# Patient Record
Sex: Female | Born: 1985 | Race: White | Hispanic: No | Marital: Single
Health system: Southern US, Community
[De-identification: ages and names within clinical notes are randomized; demographics above are authoritative.]

---

## 2011-08-15 ENCOUNTER — Ambulatory Visit: Payer: Self-pay | Admitting: Surgery

## 2011-10-16 ENCOUNTER — Emergency Department: Payer: Self-pay | Admitting: Unknown Physician Specialty

## 2011-10-16 LAB — CSF CELL COUNT WITH DIFFERENTIAL
CSF Tube #: 1
CSF Tube #: 3
Lymphocytes: 0 %
Monocytes/Macrophages: 0 %
Neutrophils: 0 %
Neutrophils: 0 %
Other Cells: 0 %
RBC (CSF): 2 /mm3
RBC (CSF): 7 /mm3
WBC (CSF): 0 /mm3

## 2011-10-16 LAB — CBC
HCT: 37.7 % (ref 35.0–47.0)
HGB: 12.7 g/dL (ref 12.0–16.0)
MCHC: 33.7 g/dL (ref 32.0–36.0)
RDW: 13.3 % (ref 11.5–14.5)
WBC: 7.4 10*3/uL (ref 3.6–11.0)

## 2011-10-16 LAB — COMPREHENSIVE METABOLIC PANEL
Albumin: 4.5 g/dL (ref 3.4–5.0)
Alkaline Phosphatase: 75 U/L (ref 50–136)
Anion Gap: 12 (ref 7–16)
BUN: 10 mg/dL (ref 7–18)
Calcium, Total: 9.7 mg/dL (ref 8.5–10.1)
Chloride: 105 mmol/L (ref 98–107)
Co2: 26 mmol/L (ref 21–32)
Glucose: 108 mg/dL — ABNORMAL HIGH (ref 65–99)
Potassium: 3.6 mmol/L (ref 3.5–5.1)
SGOT(AST): 26 U/L (ref 15–37)
SGPT (ALT): 26 U/L

## 2011-10-16 LAB — GLUCOSE, CSF: Glucose, CSF: 66 mg/dL (ref 40–75)

## 2011-10-16 LAB — SEDIMENTATION RATE: Erythrocyte Sed Rate: 13 mm/hr (ref 0–20)

## 2011-10-16 LAB — PROTEIN, CSF: Protein, CSF: 26 mg/dL (ref 15–45)

## 2011-10-21 LAB — CSF CULTURE W GRAM STAIN

## 2012-01-15 ENCOUNTER — Ambulatory Visit: Payer: Self-pay | Admitting: Family Medicine

## 2012-03-03 ENCOUNTER — Ambulatory Visit: Payer: Self-pay | Admitting: Obstetrics and Gynecology

## 2012-03-03 LAB — COMPREHENSIVE METABOLIC PANEL
Alkaline Phosphatase: 84 U/L (ref 50–136)
BUN: 7 mg/dL (ref 7–18)
Bilirubin,Total: 0.3 mg/dL (ref 0.2–1.0)
Chloride: 107 mmol/L (ref 98–107)
EGFR (African American): >60
EGFR (Non-African Amer.): >60
Glucose: 86 mg/dL (ref 65–99)
Osmolality: 279 (ref 275–301)
Potassium: 4.2 mmol/L (ref 3.5–5.1)
SGOT(AST): 25 U/L (ref 15–37)
SGPT (ALT): 22 U/L
Sodium: 141 mmol/L (ref 136–145)
Total Protein: 7.9 g/dL (ref 6.4–8.2)

## 2012-03-03 LAB — CBC
MCH: 31.6 pg (ref 26.0–34.0)
MCHC: 33.3 g/dL (ref 32.0–36.0)
MCV: 95 fL (ref 80–100)
Platelet: 292 10*3/uL (ref 150–440)
RBC: 4.01 10*6/uL (ref 3.80–5.20)
RDW: 13.1 % (ref 11.5–14.5)
WBC: 7 10*3/uL (ref 3.6–11.0)

## 2012-03-12 ENCOUNTER — Ambulatory Visit: Payer: Self-pay | Admitting: Obstetrics and Gynecology

## 2012-03-12 LAB — PREGNANCY, URINE: Pregnancy Test, Urine: NEGATIVE m[IU]/mL

## 2012-03-15 LAB — PATHOLOGY REPORT

## 2012-05-27 IMAGING — CR DG HIP COMPLETE 2+V*L*
1 series · 2 of 2 positions shown · non-contrast
Comparison: none

REASON FOR EXAM: pain
COMMENTS:

PROCEDURE:     DXR - DXR HIP LEFT COMPLETE  - October 16, 2011  [DATE]
RESULT:     Comparison:  None

[Series 1: t hip ap left · 0.14mm/px · 2 of 2 slices shown]
[im 1/2]
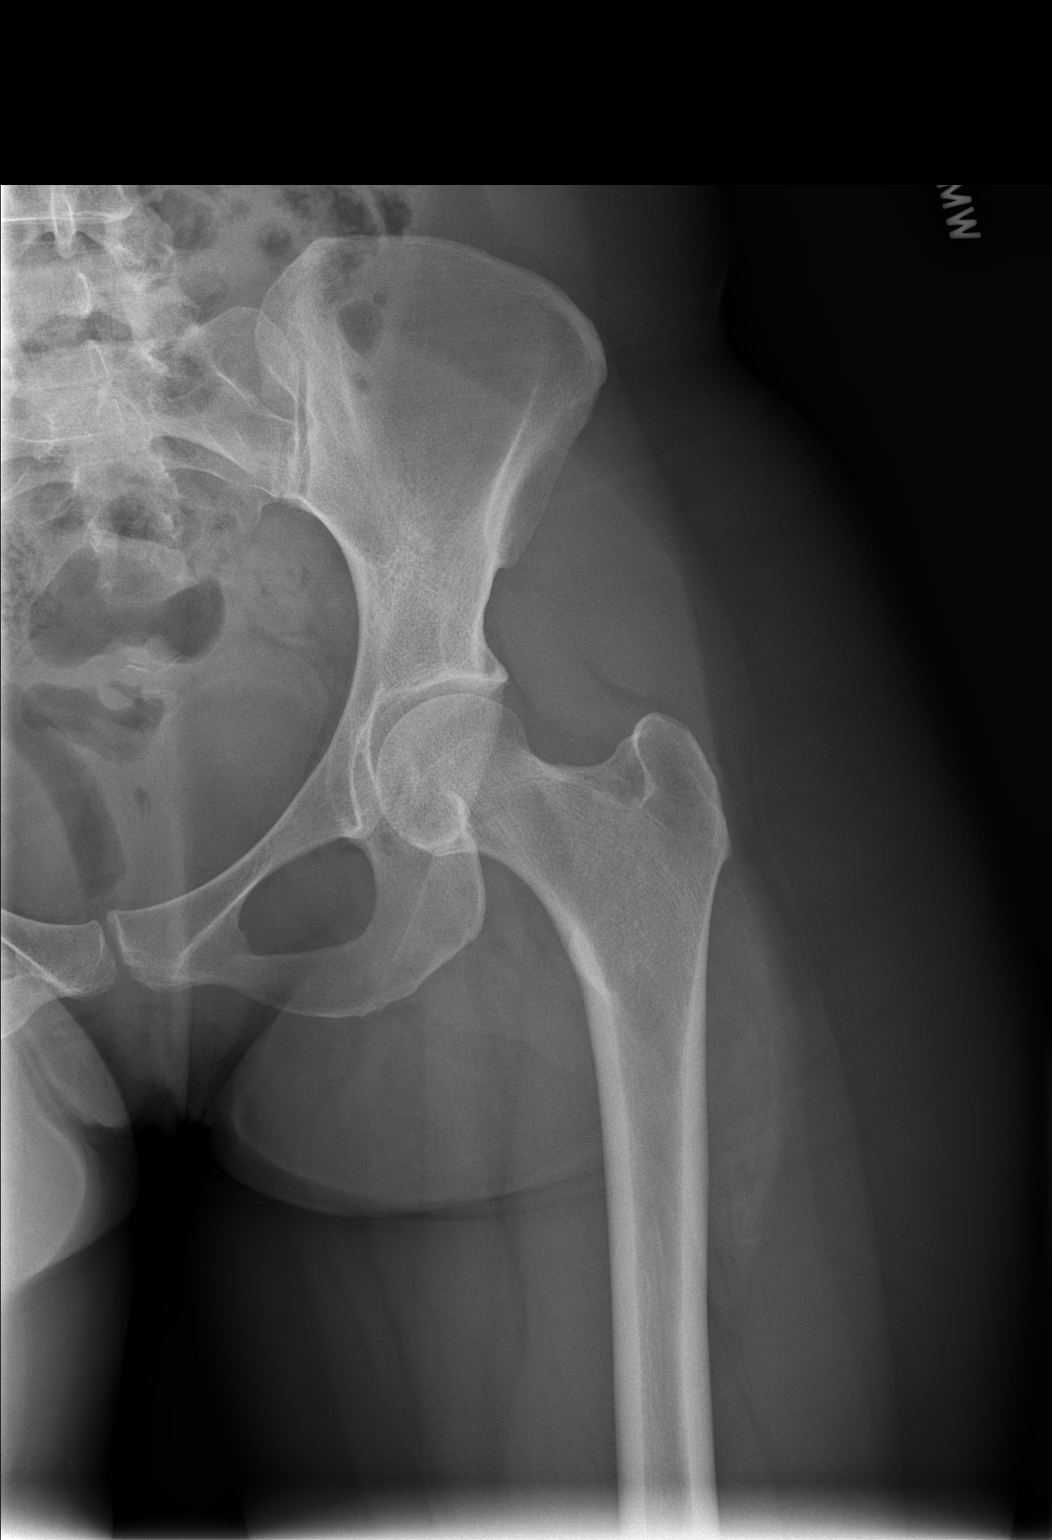
[im 2/2]
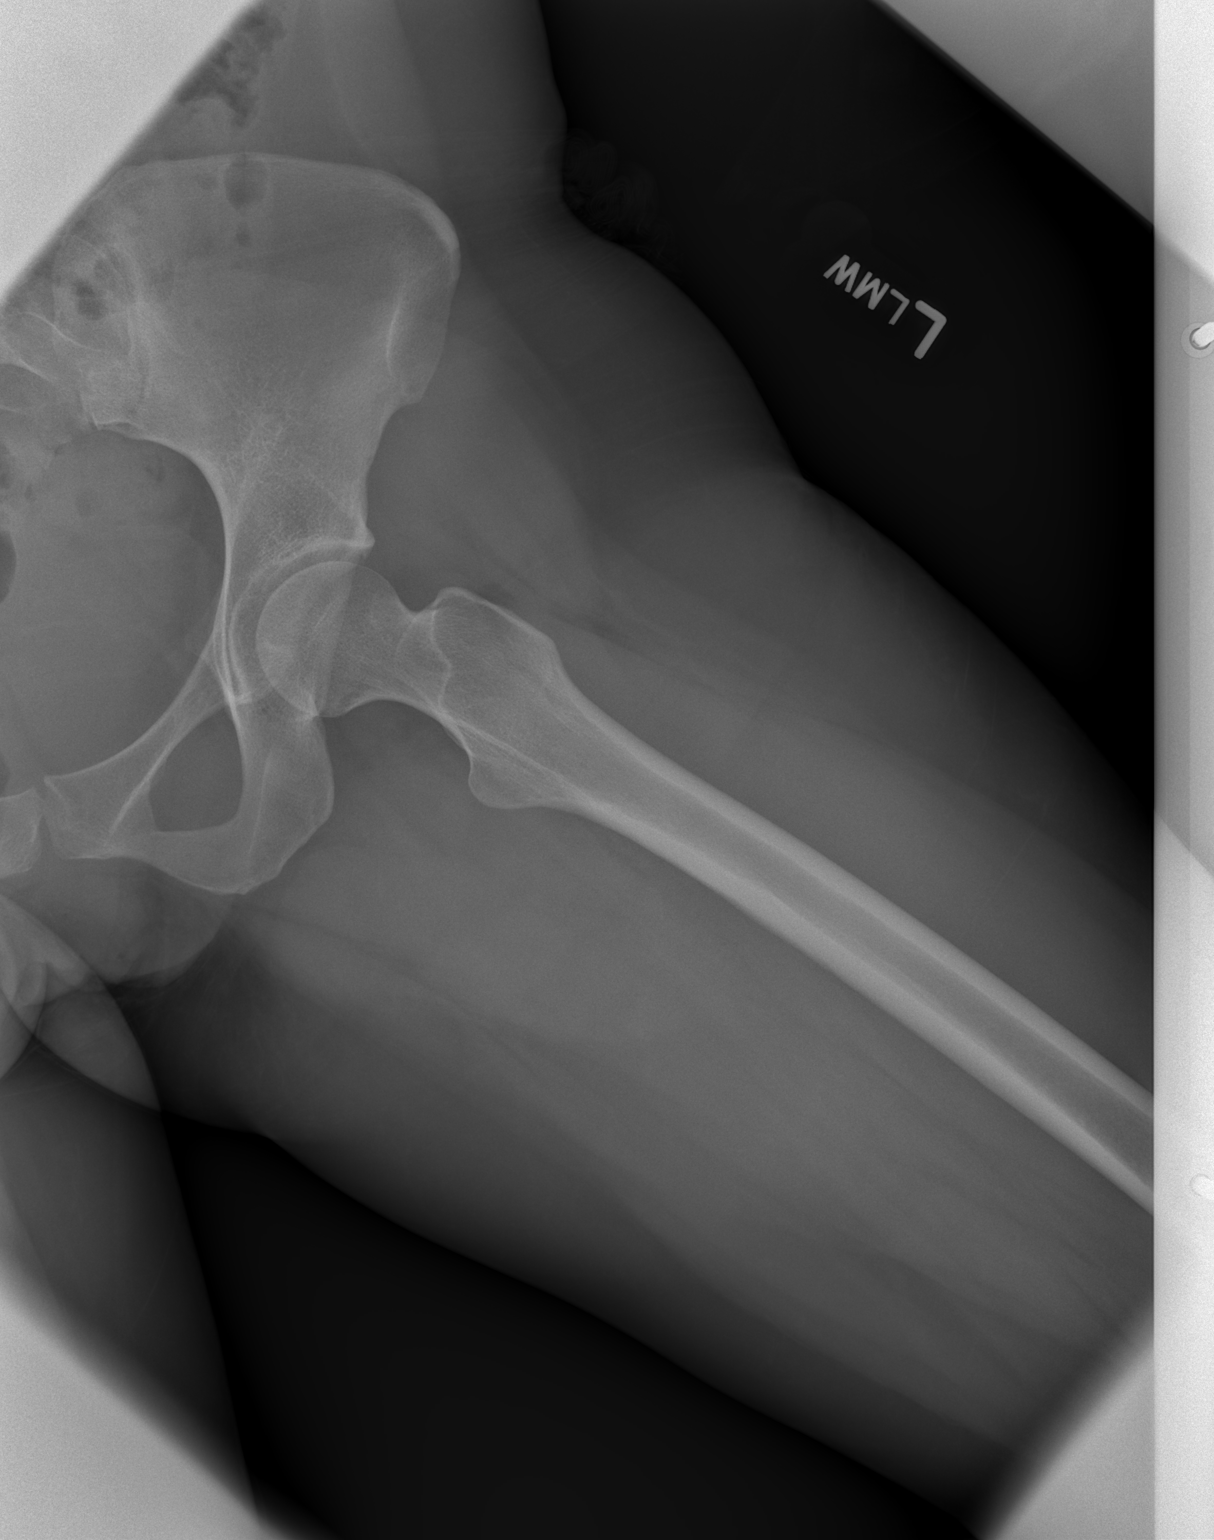

[2 of 2 positions shown; findings below may reference images not displayed]

FINDINGS: AP and frogleg lateral view of the left hip demonstrates no fracture or
dislocation. The joint space is maintained.
IMPRESSION: No acute osseous injury of the left hip.

## 2015-01-21 NOTE — Op Note (Signed)
PATIENT NAME:  Alyssa Bowman, Alyssa Bowman MR#:  [id number] DATE OF BIRTH:  1986-04-25  DATE OF PROCEDURE:  03/12/2012  PREOPERATIVE DIAGNOSIS: Chronic pelvic pain.   POSTOPERATIVE DIAGNOSES: 1. Chronic pelvic pain.  2. Endometriosis.  SURGERY: Diagnostic laparoscopy with excision of endometrial implants and fulguration of endometrial implants.   ANESTHESIA: General.   SURGEON: Will Bonnet, MD   ASSISTANT SURGEON: Barnett Applebaum, MD   ESTIMATED BLOOD LOSS: 100 mL.  OPERATIVE FLUIDS: 1600 mL.   URINE OUTPUT: 50 mL clear urine.   COMPLICATIONS: None.   FINDINGS:  1. Retroverted uterus, otherwise appears normal.  2. Multiple pelvic endometriosis implants in the right and left ovarian fossae, cul-de-sac, endometrioma in the left and right ovaries.  3. Otherwise normal appearing abdominal survey.   SPECIMENS: Peritoneum from the right cul-de-sac, from the right ovarian fossa, and from the left ovarian fossa to pathology for permanent.  INDICATIONS: Alyssa Bowman is a 29 year old G1 P1-0-0-1 female who has a long history of chronic pelvic pain which is timed with her menstrual cycles. She has been evaluated for other causes without an explanation found.  Given the cyclicity and timing with her menses of her pain she was taken to the operating room with suspicion for endometriosis for diagnostic laparoscopy.   PROCEDURE IN DETAIL: The patient was met in the preoperative area and her questions were answered. She was taken to the operating room and placed under general anesthesia which was found to be adequate. She was placed in the dorsal supine lithotomy position and prepped and draped in the usual sterile fashion. After a time-out was called, the bladder was drained with a straight catheter for the above-noted amount. A sterile speculum was placed in the vagina and the anterior lip of the cervix was grasped with a single-tooth tenaculum. An acorn uterine manipulator was affixed to the tenaculum and  the speculum was removed.   Attention was turned to the abdomen where after local anesthetic was injected and a 5 mm infrumbilical incision was made and the abdomen was entered via Optiview trocar method under direct visualization. The abdomen was verified to be entered based on opening pressure and the abdomen was then insufflated in the usual fashion with CO2. Abdominal survey was undertaken with the above-noted findings. Right and left lower quadrant ports were also created under direct intraabdominal observation. Both were 5 mm ports and were placed after injection of local anesthetic. Abdominal survey was undertaken. The biopsy of the right ovarian fossa was undertaken using laparoscopic scissors as were the other biopsies and then the remaining sites of endometriotic implants which were noted to be multiple throughout the cul-de-sac, several in both the left and right ovarian fossae, multiple on the ovaries. All of these were fulgurated using monopolar electrocautery. Upon cauterizing one of the endometriotic implants along the left cul-de-sac wall, chocolate brown fluid was noted to emanate from the implant. Also noted during fulguration of an implant on the right ovary and left ovary were small chocolate brown cysts which were opened and drained and the cyst walls were fulgurated using monopolar electrocautery. All that could reasonably be addressed was addressed and at this point I felt that further surgery was not warranted. Therefore, after verification of hemostasis, the abdomen was desufflated of CO2 and the cannulae were removed under direct visualization. Each port site was closed subcutaneously with a single 4-0 Monocryl and then the skin was closed using Dermabond. Another 10 mL of local was injected at the incision site. The tenaculum and  acorn manipulator were removed from the vagina and hemostasis was noted.   The patient tolerated the procedure well. Sponge, lap, and needle counts were  correct x2. The patient was awakened in the operating room and taken to the recovery room in stable condition.   For VTE prophylaxis, she had on pneumatic compression devices throughout the entire procedure that were in place and operating.    ____________________________ Will Bonnet, MD sdj:drc D: 03/12/2012 22:45:56 ET T: 03/13/2012 10:39:43 ET JOB#: [id number]  cc: Will Bonnet, MD, <Dictator> Will Bonnet MD ELECTRONICALLY SIGNED 03/13/2012 21:47
